# Patient Record
Sex: Female | Born: 1960 | Race: White | Hispanic: No | Marital: Married | Smoking: Never smoker
Health system: Southern US, Community
[De-identification: ages and names within clinical notes are randomized; demographics above are authoritative.]

## PROBLEM LIST (undated history)

## (undated) DIAGNOSIS — T783XXA Angioneurotic edema, initial encounter: Secondary | ICD-10-CM

## (undated) HISTORY — DX: Angioneurotic edema, initial encounter: T78.3XXA

---

## 2001-09-22 ENCOUNTER — Encounter: Admission: RE | Admit: 2001-09-22 | Discharge: 2001-09-22 | Payer: Self-pay | Admitting: Family Medicine

## 2001-09-22 ENCOUNTER — Encounter: Payer: Self-pay | Admitting: Family Medicine

## 2004-03-20 ENCOUNTER — Other Ambulatory Visit: Admission: RE | Admit: 2004-03-20 | Discharge: 2004-03-20 | Payer: Self-pay | Admitting: Family Medicine

## 2004-03-30 ENCOUNTER — Encounter: Admission: RE | Admit: 2004-03-30 | Discharge: 2004-03-30 | Payer: Self-pay | Admitting: Family Medicine

## 2004-04-03 ENCOUNTER — Encounter: Admission: RE | Admit: 2004-04-03 | Discharge: 2004-04-03 | Payer: Self-pay | Admitting: Family Medicine

## 2004-06-08 ENCOUNTER — Encounter (HOSPITAL_COMMUNITY): Admission: RE | Admit: 2004-06-08 | Discharge: 2004-07-30 | Payer: Self-pay | Admitting: Family Medicine

## 2005-03-22 ENCOUNTER — Other Ambulatory Visit: Admission: RE | Admit: 2005-03-22 | Discharge: 2005-03-22 | Payer: Self-pay | Admitting: Family Medicine

## 2005-04-14 ENCOUNTER — Encounter: Admission: RE | Admit: 2005-04-14 | Discharge: 2005-04-14 | Payer: Self-pay | Admitting: Family Medicine

## 2005-09-07 ENCOUNTER — Encounter (HOSPITAL_COMMUNITY): Admission: RE | Admit: 2005-09-07 | Discharge: 2005-09-08 | Payer: Self-pay | Admitting: Family Medicine

## 2006-04-18 ENCOUNTER — Other Ambulatory Visit: Admission: RE | Admit: 2006-04-18 | Discharge: 2006-04-18 | Payer: Self-pay | Admitting: Family Medicine

## 2006-04-18 ENCOUNTER — Encounter: Admission: RE | Admit: 2006-04-18 | Discharge: 2006-04-18 | Payer: Self-pay | Admitting: Family Medicine

## 2007-04-20 ENCOUNTER — Encounter: Admission: RE | Admit: 2007-04-20 | Discharge: 2007-04-20 | Payer: Self-pay | Admitting: Family Medicine

## 2007-05-08 ENCOUNTER — Other Ambulatory Visit: Admission: RE | Admit: 2007-05-08 | Discharge: 2007-05-08 | Payer: Self-pay | Admitting: Family Medicine

## 2008-05-02 ENCOUNTER — Encounter: Admission: RE | Admit: 2008-05-02 | Discharge: 2008-05-02 | Payer: Self-pay | Admitting: Family Medicine

## 2008-07-18 ENCOUNTER — Other Ambulatory Visit: Admission: RE | Admit: 2008-07-18 | Discharge: 2008-07-18 | Payer: Self-pay | Admitting: Family Medicine

## 2009-06-09 ENCOUNTER — Encounter: Admission: RE | Admit: 2009-06-09 | Discharge: 2009-06-09 | Payer: Self-pay | Admitting: Family Medicine

## 2009-07-31 ENCOUNTER — Other Ambulatory Visit: Admission: RE | Admit: 2009-07-31 | Discharge: 2009-07-31 | Payer: Self-pay | Admitting: Family Medicine

## 2010-07-20 ENCOUNTER — Encounter
Admission: RE | Admit: 2010-07-20 | Discharge: 2010-07-20 | Payer: Self-pay | Source: Home / Self Care | Attending: Physician Assistant | Admitting: Physician Assistant

## 2010-07-23 ENCOUNTER — Other Ambulatory Visit
Admission: RE | Admit: 2010-07-23 | Discharge: 2010-07-23 | Payer: Self-pay | Source: Home / Self Care | Admitting: Family Medicine

## 2010-08-23 ENCOUNTER — Encounter: Payer: Self-pay | Admitting: Family Medicine

## 2011-07-21 ENCOUNTER — Other Ambulatory Visit (HOSPITAL_COMMUNITY)
Admission: RE | Admit: 2011-07-21 | Discharge: 2011-07-21 | Disposition: A | Discharge status: Home / Self Care | Payer: PRIVATE HEALTH INSURANCE | Source: Ambulatory Visit | Attending: Family Medicine | Admitting: Family Medicine

## 2011-07-21 ENCOUNTER — Other Ambulatory Visit: Payer: Self-pay | Admitting: Physician Assistant

## 2011-07-21 DIAGNOSIS — Z124 Encounter for screening for malignant neoplasm of cervix: Secondary | ICD-10-CM | POA: Insufficient documentation

## 2011-07-22 ENCOUNTER — Other Ambulatory Visit: Payer: Self-pay | Admitting: Physician Assistant

## 2011-07-22 DIAGNOSIS — Z1231 Encounter for screening mammogram for malignant neoplasm of breast: Secondary | ICD-10-CM

## 2011-07-23 ENCOUNTER — Ambulatory Visit
Admission: RE | Admit: 2011-07-23 | Discharge: 2011-07-23 | Disposition: A | Discharge status: Home / Self Care | Payer: PRIVATE HEALTH INSURANCE | Source: Ambulatory Visit | Attending: Physician Assistant | Admitting: Physician Assistant

## 2011-07-23 DIAGNOSIS — Z1231 Encounter for screening mammogram for malignant neoplasm of breast: Secondary | ICD-10-CM

## 2011-10-18 ENCOUNTER — Other Ambulatory Visit: Payer: Self-pay | Admitting: Gastroenterology

## 2012-06-26 ENCOUNTER — Other Ambulatory Visit: Payer: Self-pay | Admitting: Family Medicine

## 2012-06-26 DIAGNOSIS — Z1231 Encounter for screening mammogram for malignant neoplasm of breast: Secondary | ICD-10-CM

## 2012-07-18 ENCOUNTER — Other Ambulatory Visit: Payer: Self-pay | Admitting: Physician Assistant

## 2012-07-18 ENCOUNTER — Other Ambulatory Visit (HOSPITAL_COMMUNITY)
Admission: RE | Admit: 2012-07-18 | Discharge: 2012-07-18 | Disposition: A | Discharge status: Home / Self Care | Payer: BC Managed Care – PPO | Source: Ambulatory Visit | Attending: Family Medicine | Admitting: Family Medicine

## 2012-07-18 DIAGNOSIS — Z124 Encounter for screening for malignant neoplasm of cervix: Secondary | ICD-10-CM | POA: Insufficient documentation

## 2012-07-28 ENCOUNTER — Ambulatory Visit
Admission: RE | Admit: 2012-07-28 | Discharge: 2012-07-28 | Disposition: A | Discharge status: Home / Self Care | Payer: BC Managed Care – PPO | Source: Ambulatory Visit | Attending: Family Medicine | Admitting: Family Medicine

## 2012-07-28 DIAGNOSIS — Z1231 Encounter for screening mammogram for malignant neoplasm of breast: Secondary | ICD-10-CM

## 2013-07-05 ENCOUNTER — Other Ambulatory Visit: Payer: Self-pay

## 2013-07-05 DIAGNOSIS — Z1231 Encounter for screening mammogram for malignant neoplasm of breast: Secondary | ICD-10-CM

## 2013-07-19 ENCOUNTER — Other Ambulatory Visit (HOSPITAL_COMMUNITY)
Admission: RE | Admit: 2013-07-19 | Discharge: 2013-07-19 | Disposition: A | Discharge status: Home / Self Care | Payer: BC Managed Care – PPO | Source: Ambulatory Visit | Attending: Family Medicine | Admitting: Family Medicine

## 2013-07-19 ENCOUNTER — Other Ambulatory Visit: Payer: Self-pay | Admitting: Physician Assistant

## 2013-07-19 DIAGNOSIS — Z124 Encounter for screening for malignant neoplasm of cervix: Secondary | ICD-10-CM | POA: Insufficient documentation

## 2013-08-09 ENCOUNTER — Ambulatory Visit
Admission: RE | Admit: 2013-08-09 | Discharge: 2013-08-09 | Disposition: A | Discharge status: Home / Self Care | Payer: BC Managed Care – PPO | Source: Ambulatory Visit

## 2013-08-09 DIAGNOSIS — Z1231 Encounter for screening mammogram for malignant neoplasm of breast: Secondary | ICD-10-CM

## 2014-07-16 ENCOUNTER — Other Ambulatory Visit: Payer: Self-pay | Admitting: Physician Assistant

## 2014-07-16 ENCOUNTER — Other Ambulatory Visit: Payer: Self-pay

## 2014-07-16 ENCOUNTER — Other Ambulatory Visit (HOSPITAL_COMMUNITY)
Admission: RE | Admit: 2014-07-16 | Discharge: 2014-07-16 | Disposition: A | Discharge status: Home / Self Care | Payer: BC Managed Care – PPO | Source: Ambulatory Visit | Attending: Family Medicine | Admitting: Family Medicine

## 2014-07-16 DIAGNOSIS — Z124 Encounter for screening for malignant neoplasm of cervix: Secondary | ICD-10-CM | POA: Diagnosis not present

## 2014-07-16 DIAGNOSIS — Z1231 Encounter for screening mammogram for malignant neoplasm of breast: Secondary | ICD-10-CM

## 2014-07-17 LAB — CYTOLOGY - PAP

## 2014-08-12 ENCOUNTER — Ambulatory Visit
Admission: RE | Admit: 2014-08-12 | Discharge: 2014-08-12 | Disposition: A | Discharge status: Home / Self Care | Payer: BLUE CROSS/BLUE SHIELD | Source: Ambulatory Visit

## 2014-08-12 DIAGNOSIS — Z1231 Encounter for screening mammogram for malignant neoplasm of breast: Secondary | ICD-10-CM

## 2015-07-09 ENCOUNTER — Other Ambulatory Visit: Payer: Self-pay

## 2015-07-09 DIAGNOSIS — Z1231 Encounter for screening mammogram for malignant neoplasm of breast: Secondary | ICD-10-CM

## 2015-08-14 ENCOUNTER — Ambulatory Visit
Admission: RE | Admit: 2015-08-14 | Discharge: 2015-08-14 | Disposition: A | Discharge status: Home / Self Care | Payer: BLUE CROSS/BLUE SHIELD | Source: Ambulatory Visit

## 2015-08-14 DIAGNOSIS — Z1231 Encounter for screening mammogram for malignant neoplasm of breast: Secondary | ICD-10-CM

## 2016-07-16 ENCOUNTER — Other Ambulatory Visit: Payer: Self-pay | Admitting: Physician Assistant

## 2016-07-16 DIAGNOSIS — Z1231 Encounter for screening mammogram for malignant neoplasm of breast: Secondary | ICD-10-CM

## 2016-08-24 ENCOUNTER — Ambulatory Visit
Admission: RE | Admit: 2016-08-24 | Discharge: 2016-08-24 | Disposition: A | Discharge status: Home / Self Care | Payer: PRIVATE HEALTH INSURANCE | Source: Ambulatory Visit | Attending: Physician Assistant | Admitting: Physician Assistant

## 2016-08-24 DIAGNOSIS — Z1231 Encounter for screening mammogram for malignant neoplasm of breast: Secondary | ICD-10-CM

## 2017-08-19 ENCOUNTER — Other Ambulatory Visit: Payer: Self-pay | Admitting: Physician Assistant

## 2017-08-19 DIAGNOSIS — Z1231 Encounter for screening mammogram for malignant neoplasm of breast: Secondary | ICD-10-CM

## 2017-09-06 ENCOUNTER — Other Ambulatory Visit (HOSPITAL_COMMUNITY)
Admission: RE | Admit: 2017-09-06 | Discharge: 2017-09-06 | Disposition: A | Discharge status: Home / Self Care | Payer: PRIVATE HEALTH INSURANCE | Source: Ambulatory Visit | Attending: Family Medicine | Admitting: Family Medicine

## 2017-09-06 ENCOUNTER — Other Ambulatory Visit: Payer: Self-pay | Admitting: Physician Assistant

## 2017-09-06 DIAGNOSIS — Z01419 Encounter for gynecological examination (general) (routine) without abnormal findings: Secondary | ICD-10-CM | POA: Insufficient documentation

## 2017-09-07 LAB — CYTOLOGY - PAP: DIAGNOSIS: NEGATIVE

## 2017-10-06 ENCOUNTER — Ambulatory Visit
Admission: RE | Admit: 2017-10-06 | Discharge: 2017-10-06 | Disposition: A | Discharge status: Home / Self Care | Payer: PRIVATE HEALTH INSURANCE | Source: Ambulatory Visit | Attending: Physician Assistant | Admitting: Physician Assistant

## 2017-10-06 DIAGNOSIS — Z1231 Encounter for screening mammogram for malignant neoplasm of breast: Secondary | ICD-10-CM

## 2018-10-19 ENCOUNTER — Other Ambulatory Visit: Payer: Self-pay

## 2018-10-19 ENCOUNTER — Ambulatory Visit (INDEPENDENT_AMBULATORY_CARE_PROVIDER_SITE_OTHER): Payer: PRIVATE HEALTH INSURANCE | Admitting: Allergy

## 2018-10-19 ENCOUNTER — Encounter: Payer: Self-pay | Admitting: Allergy

## 2018-10-19 VITALS — BP 116/82 | HR 82 | Resp 16 | Ht 66.5 in | Wt 142.0 lb

## 2018-10-19 DIAGNOSIS — T783XXA Angioneurotic edema, initial encounter: Secondary | ICD-10-CM

## 2018-10-19 DIAGNOSIS — T783XXD Angioneurotic edema, subsequent encounter: Secondary | ICD-10-CM | POA: Diagnosis not present

## 2018-10-19 DIAGNOSIS — J3089 Other allergic rhinitis: Secondary | ICD-10-CM

## 2018-10-19 HISTORY — DX: Angioneurotic edema, initial encounter: T78.3XXA

## 2018-10-19 NOTE — Progress Notes (Signed)
New Patient Note  RE: Barbara Lin MRN: [id number] DOB: 1961-07-17 Date of Office Visit: 10/19/2018  Referring provider: Milus Height, PA-C Primary care provider: Milus Height, PA-C  Chief Complaint: Allergies  History of Present Illness: I had the pleasure of seeing Barbara Lin for initial evaluation at the [facility] on 10/19/2018. She is a 58 y.o. female, who is referred here by Milus Height, PA-C for the evaluation of allergies.   She is concerned about peanuts/tree nuts possibly. This started around January. She noticed that after eating mixed tree nuts the following morning she would wake up with swollen lower lips only. Denies any other symptoms and no respiratory compromise. This happened daily for 3 weeks. Denies any associated cofactors such as exertion, infection, NSAID use, or alcohol consumption. The symptoms do lasts for about 1 day. She was not evaluated in ED. Since this episode, she does report eating peanut butter and no issues. She has not had any tree nuts since then. There was also a concern about shrimp but some of these episodes occurred without shrimp ingestion.  She does not have access to epinephrine autoinjector.   Patient has been on lisinopril for the past 5-10 years with no change in dosing.    Past work up includes: Dietary History: patient has been eating other foods including milk, eggs, peanut - last week, sesame, shellfish, seafood, soy, wheat, meats, fruits and vegetables.  Patient used to tolerate tree nuts previously with no issues.   Assessment and Plan: Barbara Lin is a 58 y.o. female with: Angioedema of lips 3 week history of almost daily lip angioedema which resolved with prednisone. Denies any other symptoms. Concerned about peanuts/tree nuts as she was eating them prior to going to bed but the lip angioedema occurred in the morning. Had peanuts since then with no issues but hasn't eaten tree nuts. Denies any changes in  diet, medications, personal care products. She has been on lisinopril for the past 5-10 years with no recent dose increase.  Today's skin testing showed: Positive to grass pollen only. Tree nuts and peanuts were negative.   Based on clinical history there is a concern about ace-inhibitor induced angioedema which can occur in patients who have been chronically on the medication before onset.  Stop lisinopril and call PCP about changing blood pressure medication. Even if the lisinopril did not cause this, patients with history of lip angioedema are at increased risk of developing ace-inhibitor induced angioedema in the future.  Get bloodwork as below to rule out other etiologies.   May continue to eat peanuts as before. Avoid tree nuts for now until bloodwork results are back.  For mild symptoms you can take over the counter antihistamines such as Benadryl and monitor symptoms closely. If symptoms worsen or if you have severe symptoms including breathing issues, throat closure, significant swelling, whole body hives, severe diarrhea and vomiting, lightheadedness then seek immediate medical care.   Other allergic rhinitis Perennial rhinitis symptoms for the past 10+ years. Triggers include dust. Taking antihistamines and Flonase prn with some benefit.  Today's skin testing was only positive to grass pollen which does not explain her perennial symptoms. Discussed environmental control measures.   Will get bloodwork to double check.  May use over the counter antihistamines such as Zyrtec (cetirizine), Claritin (loratadine), Allegra (fexofenadine), or Xyzal (levocetirizine) daily as needed.  May use Flonase 1-2 sprays daily as needed.   Return in about 3 months (around 01/19/2019).  Lab Orders  C1 Esterase Inhibitor, Functional     C4 complement     Complement component c1q     Allergens Zone 2     Allergens(7)     C1 Esterase Inhibitor  Other allergy screening: Asthma: no Rhino  conjunctivitis: yes  She reports symptoms of sneezing, rhinorrhea, itchy/watery eyes. Symptoms have been going on for 10+ years. The symptoms are present all year around. Other triggers include exposure to dust. Anosmia: no. Headache: yes. She has used Flonase prn, Chlor tab with some improvement in symptoms. Sinus infections: no. Previous work up includes: none. Previous ENT evaluation: no.  Medication allergy: no Hymenoptera allergy: no Urticaria: no Eczema:no History of recurrent infections suggestive of immunodeficency: no  Diagnostics: Skin Testing: Environmental allergy panel and select foods.  Positive test to: grass pollen only. Negative test to: tree nuts/peanuts.  Results discussed with patient/family. Airborne Adult Perc - 10/19/18 1428    Time Antigen Placed  1428    Allergen Manufacturer  Greer    Location  Back    Number of Test  59    Panel 1  Select    1. Control-Buffer 50% Glycerol  Negative    2. Control-Histamine 1 mg/ml  3+    3. Albumin saline  Negative    4. Bahia  Negative    5. [place]  3+    6. Johnson  Negative    7. Kentucky Blue  Negative    8. Meadow Fescue  Negative    9. Perennial Rye  Negative    10. Sweet Vernal  Negative    11. Timothy  Negative    12. Cocklebur  Negative    13. Burweed Marshelder  Negative    14. Ragweed, short  Negative    15. Ragweed, Giant  Negative    16. Plantain,  English  Negative    17. Lamb's Quarters  Negative    18. Sheep Sorrell  Negative    19. Rough Pigweed  Negative    20. Marsh Elder, Rough  Negative    21. Mugwort, Common  Negative    22. Ash mix  Negative    23. Birch mix  Negative    24. Beech American  Negative    25. Box, Elder  Negative    26. Cedar, red  Negative    27. Cottonwood, Guinea-Bissau  Negative    28. Elm mix  Negative    29. Hickory mix  Negative    30. Maple mix  Negative    31. Oak, Guinea-[place]issau mix  Negative    32. Pecan Pollen  Negative    33. Pine mix  Negative    34. Sycamore Eastern   Negative    35. Walnut, Black Pollen  Negative    36. Alternaria alternata  Negative    37. Cladosporium Herbarum  Negative    38. Aspergillus mix  Negative    39. Penicillium mix  Negative    40. Bipolaris sorokiniana (Helminthosporium)  Negative    41. Drechslera spicifera (Curvularia)  Negative    42. Mucor plumbeus  Negative    43. Fusarium moniliforme  Negative    44. Aureobasidium pullulans (pullulara)  Negative    45. Rhizopus oryzae  Negative    46. Botrytis cinera  Negative    47. Epicoccum nigrum  Negative    48. Phoma betae  Negative    49. Candida Albicans  Negative    50. Trichophyton mentagrophytes  Negative    51.  Mite, D Farinae  5,000 AU/ml  Negative    52. Mite, D Pteronyssinus  5,000 AU/ml  Negative    53. Cat Hair 10,000 BAU/ml  Negative    54.  Dog Epithelia  Negative    55. Mixed Feathers  Negative    56. Horse Epithelia  Negative    57. Cockroach, German  Negative    58. Mouse  Negative    59. Tobacco Leaf  Negative     Food Adult Perc - 10/19/18 1400    Time Antigen Placed  1428    Allergen Manufacturer  Waynette Buttery    Location  Back    Number of allergen test  9    1. Peanut  Negative    10. Cashew  Negative    11. Pecan Food  Negative    12. Walnut Food  Negative    13. Almond  Negative    14. Hazelnut  Negative    15. [place] nut  Negative    16. Coconut  Negative    17. Pistachio  Negative       Past Medical History: Patient Active Problem List   Diagnosis Date Noted   Angioedema of lips 10/19/2018   Other allergic rhinitis 10/19/2018   Past Medical History:  Diagnosis Date   Angioedema of lips 10/19/2018   Past Surgical History: History reviewed. No pertinent surgical history. Medication List:  Current Outpatient Medications  Medication Sig Dispense Refill   Chlorpheniramine Maleate (CHLOR-TABLETS PO) Take by mouth.     rizatriptan (MAXALT) 10 MG tablet      rosuvastatin (CRESTOR) 10 MG tablet      No current  facility-administered medications for this visit.    Allergies: No Known Allergies Social History: Social History   Socioeconomic History   Marital status: Married    Spouse name: Not on file   Number of children: Not on file   Years of education: Not on file   Highest education level: Not on file  Occupational History   Not on file  Social Needs   Financial resource strain: Not on file   Food insecurity:    Worry: Not on file    Inability: Not on file   Transportation needs:    Medical: Not on file    Non-medical: Not on file  Tobacco Use   Smoking status: Never Smoker   Smokeless tobacco: Never Used  Substance and Sexual Activity   Alcohol use: Not on file   Drug use: Not on file   Sexual activity: Not on file  Lifestyle   Physical activity:    Days per week: Not on file    Minutes per session: Not on file   Stress: Not on file  Relationships   Social connections:    Talks on phone: Not on file    Gets together: Not on file    Attends religious service: Not on file    Active member of club or organization: Not on file    Attends meetings of clubs or organizations: Not on file    Relationship status: Not on file  Other Topics Concern   Not on file  Social History Narrative   Not on file   Lives in a 58 year old house. Smoking: denies Occupation: Chief Operating Officer HistorySurveyor, minerals in the house: no Engineer, civil (consulting) in the family room: no Carpet in the bedroom: yes Heating: gas Cooling: central Pet: yes 1 outdoor dog x 5 yrs  Family History: Family  History  Problem Relation Age of Onset   Angioedema Father    Urticaria Neg Hx    Immunodeficiency Neg Hx    Eczema Neg Hx    Atopy Neg Hx    Asthma Neg Hx    Allergic rhinitis Neg Hx    Review of Systems  Constitutional: Negative for appetite change, chills, fever and unexpected weight change.  HENT: Positive for congestion and rhinorrhea.   Eyes: Negative for  itching.  Respiratory: Negative for cough, chest tightness, shortness of breath and wheezing.   Cardiovascular: Negative for chest pain.  Gastrointestinal: Negative for abdominal pain.  Genitourinary: Negative for difficulty urinating.  Skin: Negative for rash.  Allergic/Immunologic: Positive for environmental allergies.  Neurological: Negative for headaches.   Objective: BP 116/82    Pulse 82    Resp 16    Ht 5' 6.5" (1.689 m)    Wt 142 lb (64.4 kg)    SpO2 96%    BMI 22.58 kg/m  Body mass index is 22.58 kg/m. Physical Exam  Constitutional: She is oriented to person, place, and time. She appears well-developed and well-nourished.  HENT:  Head: Normocephalic and atraumatic.  Right Ear: External ear normal.  Left Ear: External ear normal.  Nose: Nose normal.  Mouth/Throat: Oropharynx is clear and moist.  Eyes: Conjunctivae and EOM are normal.  Neck: Neck supple.  Cardiovascular: Normal rate, regular rhythm and normal heart sounds. Exam reveals no gallop and no friction rub.  No murmur heard. Pulmonary/Chest: Effort normal and breath sounds normal. She has no wheezes. She has no rales.  Abdominal: Soft.  Neurological: She is alert and oriented to person, place, and time.  Skin: Skin is warm. No rash noted.  Psychiatric: She has a normal mood and affect. Her behavior is normal.  Nursing note and vitals reviewed.  The plan was reviewed with the patient/family, and all questions/concerned were addressed.  It was my pleasure to see Barbara Lin today and participate in her care. Please feel free to contact me with any questions or concerns.  Sincerely,  Wyline Mood, DO Allergy & Immunology  [facility] office: [phone] [facility] office: [phone]

## 2018-10-19 NOTE — Assessment & Plan Note (Signed)
Perennial rhinitis symptoms for the past 10+ years. Triggers include dust. Taking antihistamines and Flonase prn with some benefit.  Today's skin testing was only positive to grass pollen which does not explain her perennial symptoms. Discussed environmental control measures.   Will get bloodwork to double check.  May use over the counter antihistamines such as Zyrtec (cetirizine), Claritin (loratadine), Allegra (fexofenadine), or Xyzal (levocetirizine) daily as needed.  May use Flonase 1-2 sprays daily as needed.

## 2018-10-19 NOTE — Assessment & Plan Note (Signed)
3 week history of almost daily lip angioedema which resolved with prednisone. Denies any other symptoms. Concerned about peanuts/tree nuts as she was eating them prior to going to bed but the lip angioedema occurred in the morning. Had peanuts since then with no issues but hasn't eaten tree nuts. Denies any changes in diet, medications, personal care products. She has been on lisinopril for the past 5-10 years with no recent dose increase.  Today's skin testing showed: Positive to grass pollen only. Tree nuts and peanuts were negative.   Based on clinical history there is a concern about ace-inhibitor induced angioedema which can occur in patients who have been chronically on the medication before onset.  Stop lisinopril and call PCP about changing blood pressure medication. Even if the lisinopril did not cause this, patients with history of lip angioedema are at increased risk of developing ace-inhibitor induced angioedema in the future.  Get bloodwork as below to rule out other etiologies.   May continue to eat peanuts as before. Avoid tree nuts for now until bloodwork results are back.  For mild symptoms you can take over the counter antihistamines such as Benadryl and monitor symptoms closely. If symptoms worsen or if you have severe symptoms including breathing issues, throat closure, significant swelling, whole body hives, severe diarrhea and vomiting, lightheadedness then seek immediate medical care.

## 2018-10-19 NOTE — Patient Instructions (Addendum)
Today's skin testing showed: Positive to grass pollen only.  Tree nuts and peanuts were negative.   Stop lisinopril and call your PCP about changing your blood pressure medication as patients with history of lip angioedema are at increased risk of developing ace-inhibitor induced angioedema in the future.  Get bloodwork - C1 esterase inhibitor & function, C4, C1Q, tree nut panel, IgE with zone 2  May continue to eat peanuts as before. Avoid tree nuts for now until bloodwork results are back. For mild symptoms you can take over the counter antihistamines such as Benadryl and monitor symptoms closely. If symptoms worsen or if you have severe symptoms including breathing issues, throat closure, significant swelling, whole body hives, severe diarrhea and vomiting, lightheadedness then inject epinephrine and seek immediate medical care afterwards.  May use over the counter antihistamines such as Zyrtec (cetirizine), Claritin (loratadine), Allegra (fexofenadine), or Xyzal (levocetirizine) daily as needed.  Follow up in 3 months or sooner if needed. Let us know if you have any additional episodes and lip swelling and take pictures.   Reducing Pollen Exposure . Pollen seasons: trees (spring), grass (summer) and ragweed/weeds (fall). Marland Kitchen Keep windows closed in your home and car to lower pollen exposure.  Lilian Kapur air conditioning in the bedroom and throughout the house if possible.  . Avoid going out in dry windy days - especially early morning. . Pollen counts are highest between 5 - 10 AM and on dry, hot and windy days.  . Save outside activities for late afternoon or after a heavy rain, when pollen levels are lower.  . Avoid mowing of grass if you have grass pollen allergy. Marland Kitchen Be aware that pollen can also be transported indoors on people and pets.  . Dry your clothes in an automatic dryer rather than hanging them outside where they might collect pollen.  . Rinse hair and eyes before bedtime.

## 2018-10-25 LAB — ALLERGENS(7)
Brazil Nut IgE: <0.1 kU/L
F020-IgE Almond: <0.1 kU/L
F202-IgE Cashew Nut: <0.1 kU/L
Hazelnut (Filbert) IgE: 0.1 kU/L — AB
Peanut IgE: <0.1 kU/L
Pecan Nut IgE: <0.1 kU/L
Walnut IgE: 0.12 kU/L — AB

## 2018-10-25 LAB — IGE+ALLERGENS ZONE 2(30)
Alternaria Alternata IgE: <0.1 kU/L
Amer Sycamore IgE Qn: <0.1 kU/L
Aspergillus Fumigatus IgE: <0.1 kU/L
Bahia Grass IgE: <0.1 kU/L
Bermuda Grass IgE: <0.1 kU/L
Cat Dander IgE: <0.1 kU/L
Cedar, Mountain IgE: <0.1 kU/L
Cladosporium Herbarum IgE: <0.1 kU/L
Cockroach, American IgE: <0.1 kU/L
Common Silver Birch IgE: <0.1 kU/L
D Farinae IgE: <0.1 kU/L
D Pteronyssinus IgE: <0.1 kU/L
Dog Dander IgE: <0.1 kU/L
Elm, American IgE: <0.1 kU/L
Hickory, White IgE: <0.1 kU/L
IgE (Immunoglobulin E), Serum: 1330 [IU]/mL — ABNORMAL HIGH (ref 6–495)
Johnson Grass IgE: <0.1 kU/L
Maple/Box Elder IgE: <0.1 kU/L
Mucor Racemosus IgE: <0.1 kU/L
Mugwort IgE Qn: <0.1 kU/L
Nettle IgE: <0.1 kU/L
Oak, White IgE: <0.1 kU/L
Penicillium Chrysogen IgE: <0.1 kU/L
Pigweed, Rough IgE: <0.1 kU/L
Plantain, English IgE: <0.1 kU/L
Ragweed, Short IgE: 0.12 kU/L — AB
Sheep Sorrel IgE Qn: <0.1 kU/L
Stemphylium Herbarum IgE: <0.1 kU/L
Sweet gum IgE RAST Ql: <0.1 kU/L
Timothy Grass IgE: <0.1 kU/L
White Mulberry IgE: <0.1 kU/L

## 2018-10-25 LAB — C4 COMPLEMENT: Complement C4, Serum: 20 mg/dL (ref 14–44)

## 2018-10-25 LAB — C1 ESTERASE INHIBITOR: C1INH SerPl-mCnc: 31 mg/dL (ref 21–39)

## 2018-10-25 LAB — COMPLEMENT COMPONENT C1Q: Complement C1Q: 12.3 mg/dL (ref 10.3–20.5)

## 2018-10-25 LAB — C1 ESTERASE INHIBITOR, FUNCTIONAL: C1INH Functional/C1INH Total MFr SerPl: 98 %mean normal

## 2019-01-12 ENCOUNTER — Other Ambulatory Visit: Payer: Self-pay | Admitting: Physician Assistant

## 2019-01-12 DIAGNOSIS — Z1231 Encounter for screening mammogram for malignant neoplasm of breast: Secondary | ICD-10-CM

## 2019-01-16 ENCOUNTER — Ambulatory Visit
Admission: RE | Admit: 2019-01-16 | Discharge: 2019-01-16 | Disposition: A | Discharge status: Home / Self Care | Payer: PRIVATE HEALTH INSURANCE | Source: Ambulatory Visit | Attending: Physician Assistant | Admitting: Physician Assistant

## 2019-01-16 ENCOUNTER — Other Ambulatory Visit: Payer: Self-pay

## 2019-01-16 DIAGNOSIS — Z1231 Encounter for screening mammogram for malignant neoplasm of breast: Secondary | ICD-10-CM

## 2019-01-22 ENCOUNTER — Ambulatory Visit: Payer: PRIVATE HEALTH INSURANCE | Admitting: Allergy

## 2020-02-07 ENCOUNTER — Other Ambulatory Visit (HOSPITAL_COMMUNITY): Payer: Self-pay | Admitting: Physician Assistant

## 2020-08-06 ENCOUNTER — Other Ambulatory Visit (HOSPITAL_COMMUNITY): Payer: Self-pay | Admitting: Internal Medicine

## 2020-08-06 ENCOUNTER — Ambulatory Visit: Payer: PRIVATE HEALTH INSURANCE | Attending: Internal Medicine

## 2020-08-06 DIAGNOSIS — Z23 Encounter for immunization: Secondary | ICD-10-CM

## 2020-08-06 NOTE — Progress Notes (Signed)
   Covid-19 Vaccination Clinic  Name:  Mitali Shenefield    MRN: [id number] DOB: 04/25/1961  08/06/2020  Ms. Minish was observed post Covid-19 immunization for 15 minutes without incident. She was provided with Vaccine Information Sheet and instruction to access the V-Safe system.   Ms. Weiand was instructed to call 911 with any severe reactions post vaccine: Marland Kitchen Difficulty breathing  . Swelling of face and throat  . A fast heartbeat  . A bad rash all over body  . Dizziness and weakness   Immunizations Administered    Name Date Dose VIS Date Route   Pfizer COVID-19 Vaccine 08/06/2020 10:45 AM 0.3 mL 05/21/2020 Intramuscular   Manufacturer: [employer], [employer]   Lot: [id number]   NDC: [address]-4

## 2020-09-16 ENCOUNTER — Other Ambulatory Visit (HOSPITAL_COMMUNITY): Payer: Self-pay | Admitting: Physician Assistant

## 2020-10-06 ENCOUNTER — Other Ambulatory Visit: Payer: Self-pay | Admitting: Physician Assistant

## 2020-10-06 DIAGNOSIS — Z1231 Encounter for screening mammogram for malignant neoplasm of breast: Secondary | ICD-10-CM

## 2020-11-28 ENCOUNTER — Ambulatory Visit
Admission: RE | Admit: 2020-11-28 | Discharge: 2020-11-28 | Disposition: A | Discharge status: Home / Self Care | Payer: PRIVATE HEALTH INSURANCE | Source: Ambulatory Visit | Attending: Physician Assistant | Admitting: Physician Assistant

## 2020-11-28 ENCOUNTER — Other Ambulatory Visit: Payer: Self-pay

## 2020-11-28 DIAGNOSIS — Z1231 Encounter for screening mammogram for malignant neoplasm of breast: Secondary | ICD-10-CM

## 2020-12-10 ENCOUNTER — Other Ambulatory Visit (HOSPITAL_COMMUNITY): Payer: Self-pay

## 2020-12-10 MED FILL — Rosuvastatin Calcium Tab 10 MG: ORAL | 90 days supply | Qty: 90 | Fill #0 | Status: AC

## 2021-11-11 ENCOUNTER — Other Ambulatory Visit: Payer: Self-pay | Admitting: Physician Assistant

## 2021-11-11 DIAGNOSIS — Z1231 Encounter for screening mammogram for malignant neoplasm of breast: Secondary | ICD-10-CM

## 2021-11-30 ENCOUNTER — Ambulatory Visit
Admission: RE | Admit: 2021-11-30 | Discharge: 2021-11-30 | Disposition: A | Discharge status: Home / Self Care | Payer: No Typology Code available for payment source | Source: Ambulatory Visit | Attending: Physician Assistant | Admitting: Physician Assistant

## 2021-11-30 DIAGNOSIS — Z1231 Encounter for screening mammogram for malignant neoplasm of breast: Secondary | ICD-10-CM

## 2022-11-11 ENCOUNTER — Other Ambulatory Visit: Payer: Self-pay | Admitting: Physician Assistant

## 2022-11-11 DIAGNOSIS — Z1231 Encounter for screening mammogram for malignant neoplasm of breast: Secondary | ICD-10-CM

## 2022-12-20 ENCOUNTER — Ambulatory Visit
Admission: RE | Admit: 2022-12-20 | Discharge: 2022-12-20 | Disposition: A | Discharge status: Home / Self Care | Payer: No Typology Code available for payment source | Source: Ambulatory Visit | Attending: Physician Assistant | Admitting: Physician Assistant

## 2022-12-20 DIAGNOSIS — Z1231 Encounter for screening mammogram for malignant neoplasm of breast: Secondary | ICD-10-CM

## 2023-05-02 IMAGING — MG MM DIGITAL SCREENING BILAT W/ TOMO AND CAD
8 series · 9 of 24 positions shown · non-contrast
Comparison: Previous exam(s).

CLINICAL DATA: Screening.

EXAM:
DIGITAL SCREENING BILATERAL MAMMOGRAM WITH TOMOSYNTHESIS AND CAD
TECHNIQUE: Bilateral screening digital craniocaudal and mediolateral oblique
mammograms were obtained. Bilateral screening digital breast
tomosynthesis was performed. The images were evaluated with
computer-aided detection.

[R CC synth-2D]
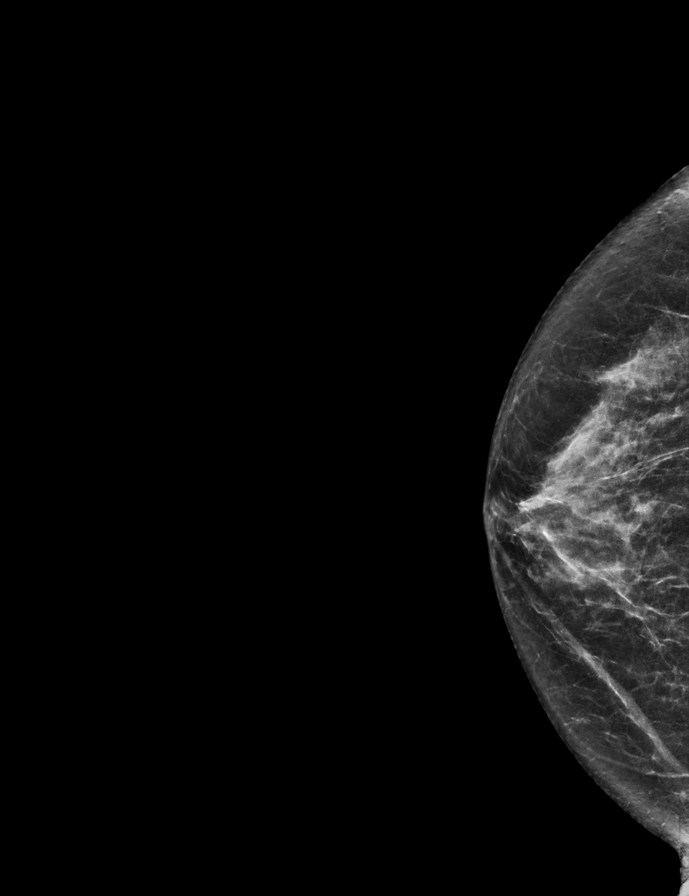

[L MLO synth-2D]
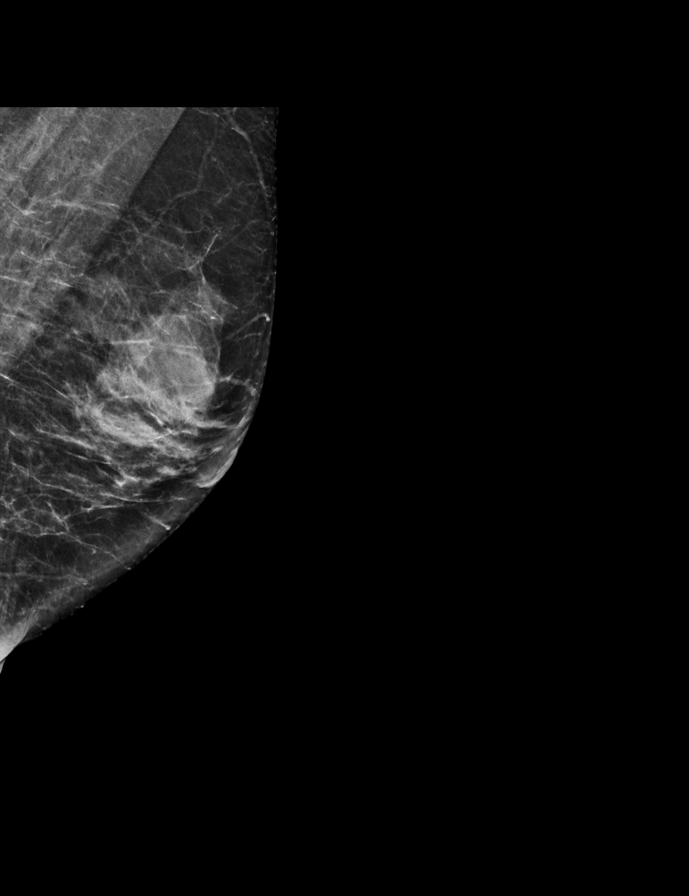

[L CC synth-2D]
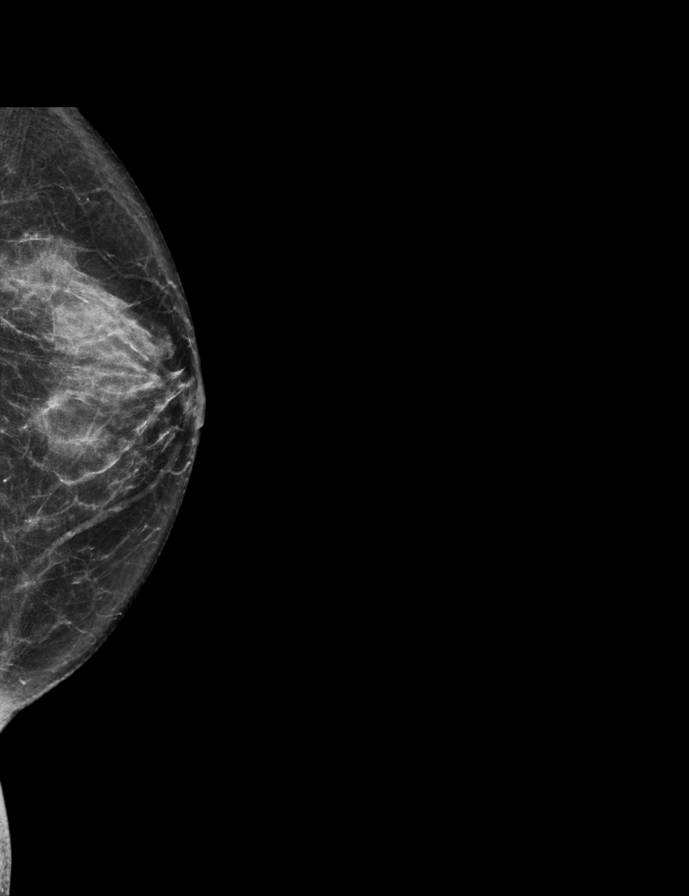

[R MLO synth-2D]
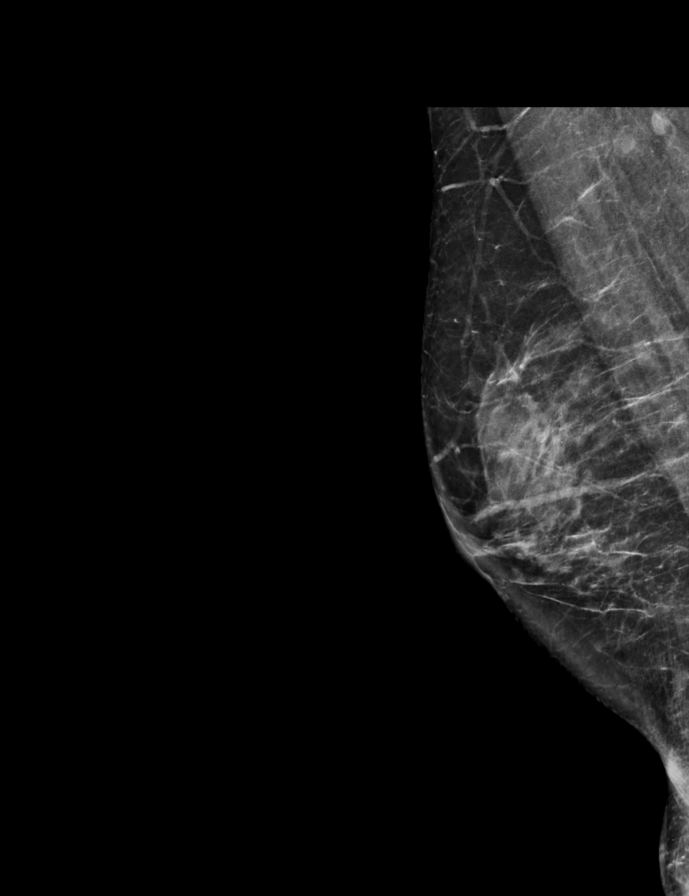

[L CC tomo · 2 of 63 frames shown]
[frame 21/63]
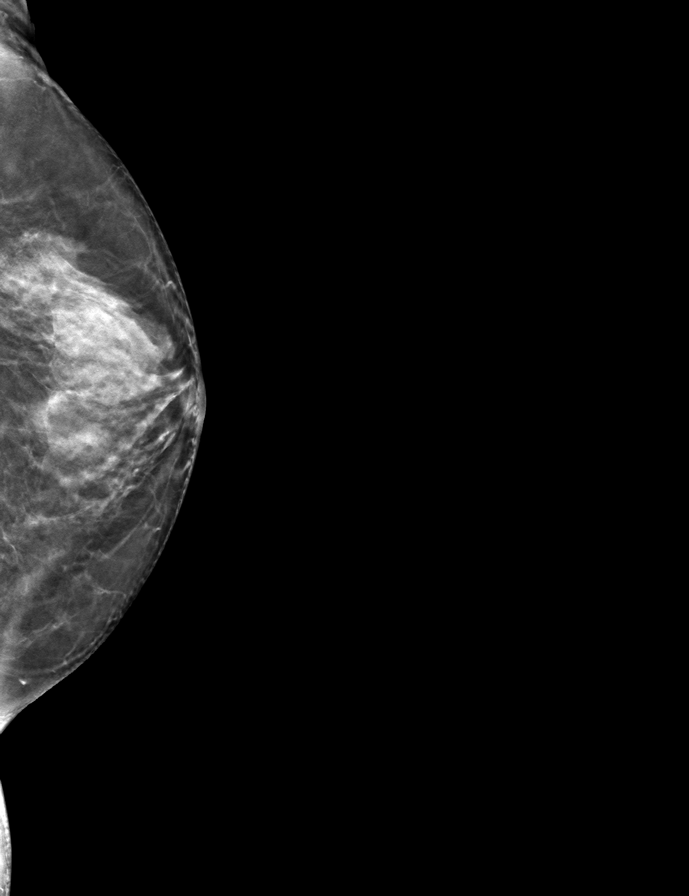
[frame 32/63]
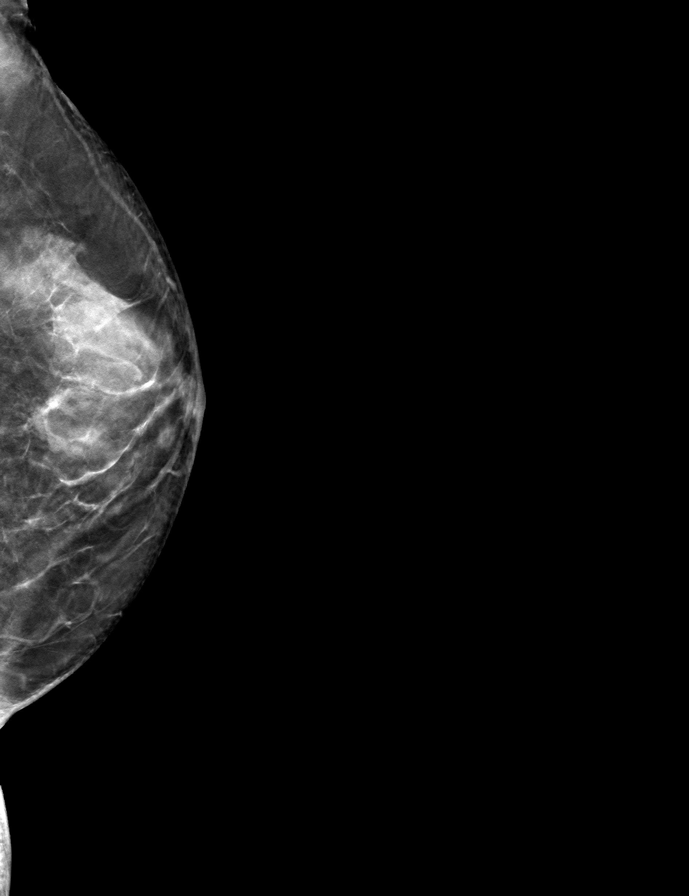

[L MLO tomo · tomo slice 29/58.0]
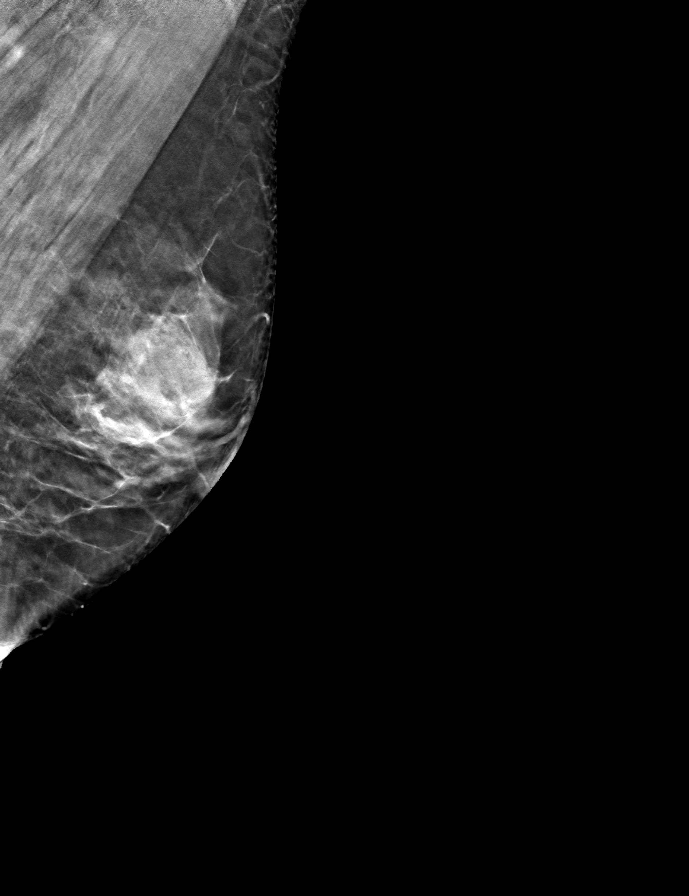

[R MLO tomo · tomo slice 31/62.0]
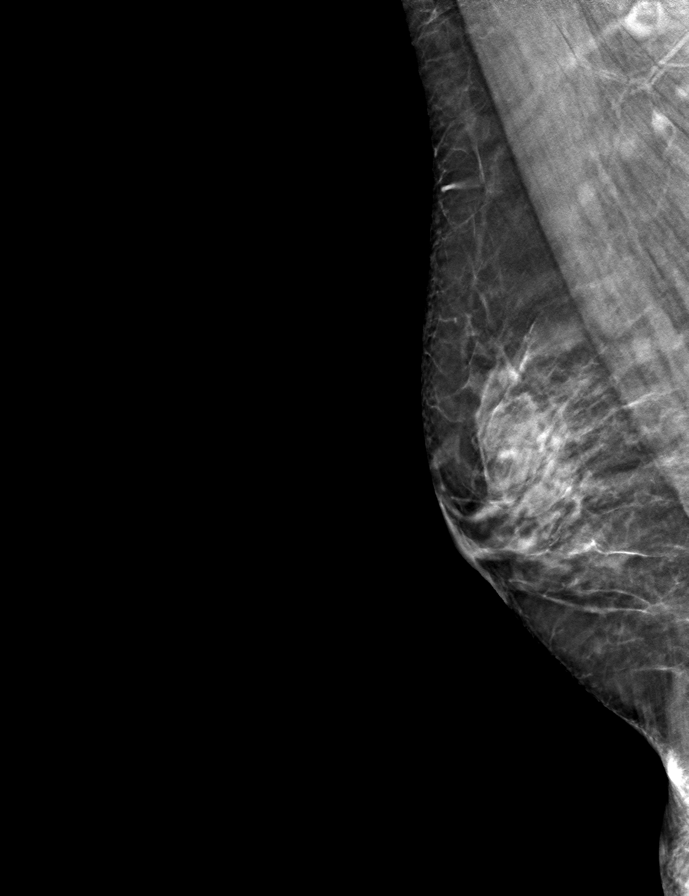

[R CC tomo · tomo slice 29/58.0]
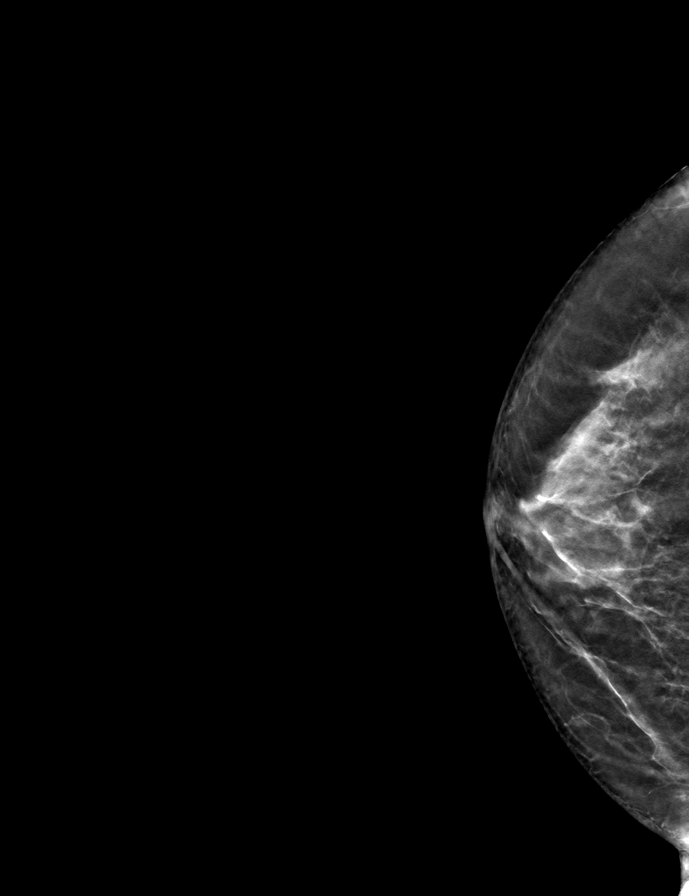

[9 of 24 positions shown; findings below may reference images not displayed]

ACR Breast Density Category c: The breast tissue is heterogeneously
dense, which may obscure small masses.
FINDINGS: There are no findings suspicious for malignancy.
IMPRESSION: No mammographic evidence of malignancy. A result letter of this
screening mammogram will be mailed directly to the patient.

RECOMMENDATION:
Screening mammogram in one year. (Code:Q3-W-BC3)

BI-RADS CATEGORY  1: Negative.

## 2023-12-21 ENCOUNTER — Other Ambulatory Visit: Payer: Self-pay | Admitting: Physician Assistant

## 2023-12-21 DIAGNOSIS — Z1231 Encounter for screening mammogram for malignant neoplasm of breast: Secondary | ICD-10-CM

## 2024-01-10 ENCOUNTER — Ambulatory Visit
Admission: RE | Admit: 2024-01-10 | Discharge: 2024-01-10 | Disposition: A | Discharge status: Home / Self Care | Source: Ambulatory Visit | Attending: Physician Assistant | Admitting: Physician Assistant

## 2024-01-10 DIAGNOSIS — Z1231 Encounter for screening mammogram for malignant neoplasm of breast: Secondary | ICD-10-CM
